# Patient Record
Sex: Male | Born: 1960 | Race: Black or African American | Hispanic: No | Marital: Married | Smoking: Current every day smoker
Health system: Southern US, Community
[De-identification: ages and names within clinical notes are randomized; demographics above are authoritative.]

## PROBLEM LIST (undated history)

## (undated) DIAGNOSIS — E7800 Pure hypercholesterolemia, unspecified: Secondary | ICD-10-CM

## (undated) DIAGNOSIS — I10 Essential (primary) hypertension: Secondary | ICD-10-CM

## (undated) HISTORY — DX: Essential (primary) hypertension: I10

---

## 2012-08-24 ENCOUNTER — Emergency Department: Payer: Self-pay | Admitting: Emergency Medicine

## 2012-09-17 ENCOUNTER — Emergency Department: Payer: Self-pay | Admitting: Emergency Medicine

## 2012-09-21 LAB — BODY FLUID CULTURE

## 2012-10-02 ENCOUNTER — Emergency Department: Payer: Self-pay | Admitting: Emergency Medicine

## 2013-06-14 ENCOUNTER — Emergency Department: Payer: Self-pay | Admitting: Emergency Medicine

## 2013-06-21 IMAGING — CR DG ELBOW COMPLETE 3+V*L*
1 series · 4 of 4 positions shown · non-contrast
Comparison: none

REASON FOR EXAM: s/p injury with painful ROM and swelling
COMMENTS:

PROCEDURE:     DXR - DXR ELBOW LT COMP W/OBLIQUES  - September 17, 2012 [DATE]
RESULT:     Comparison: 08/24/2012

[Series 1: lat · 0.17mm/px · 4 of 4 slices shown]
[im 1/4]
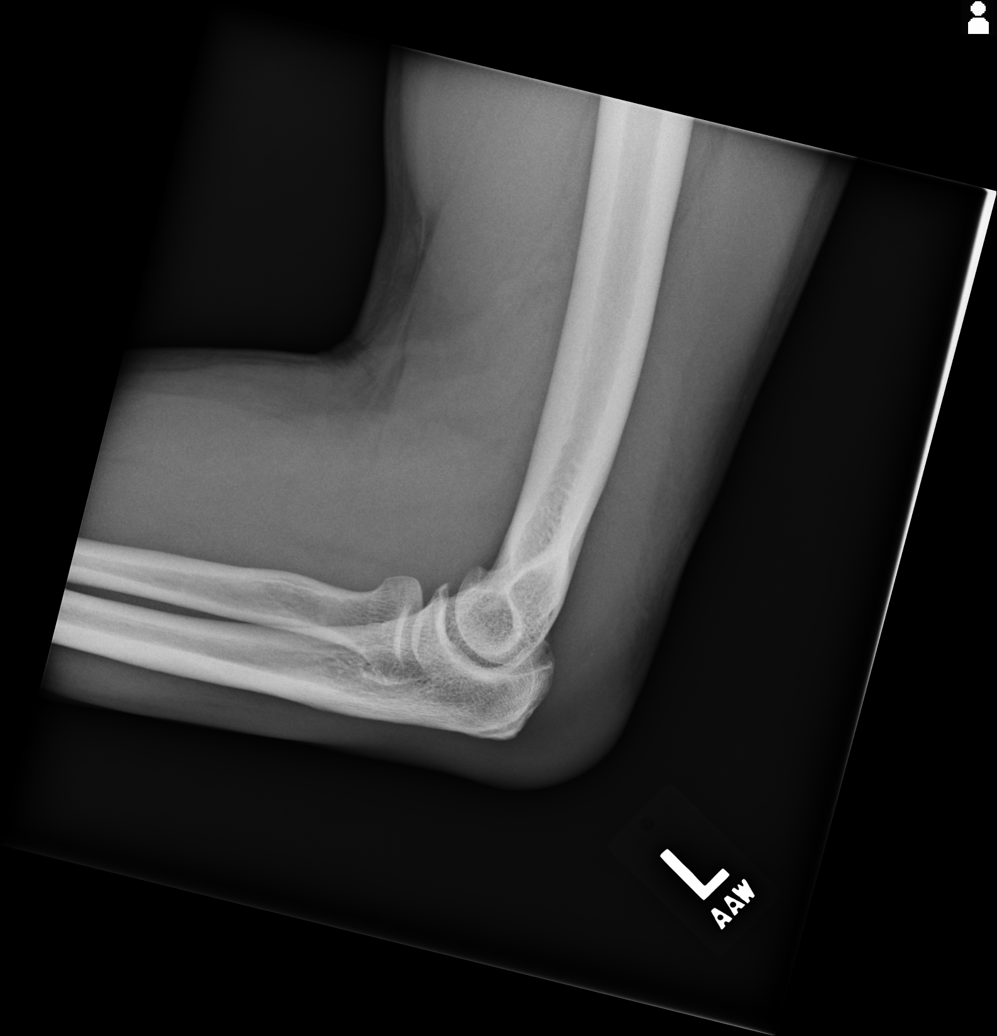
[im 2/4]
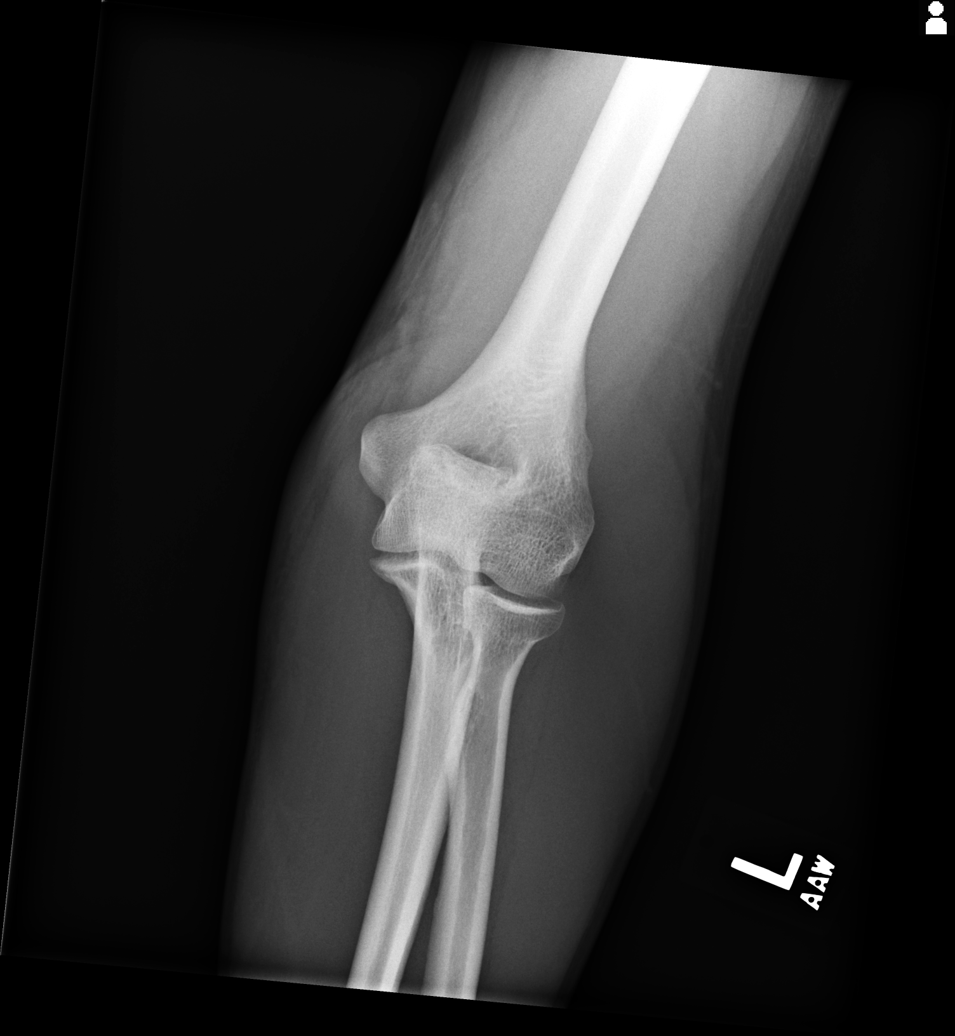
[im 3/4]
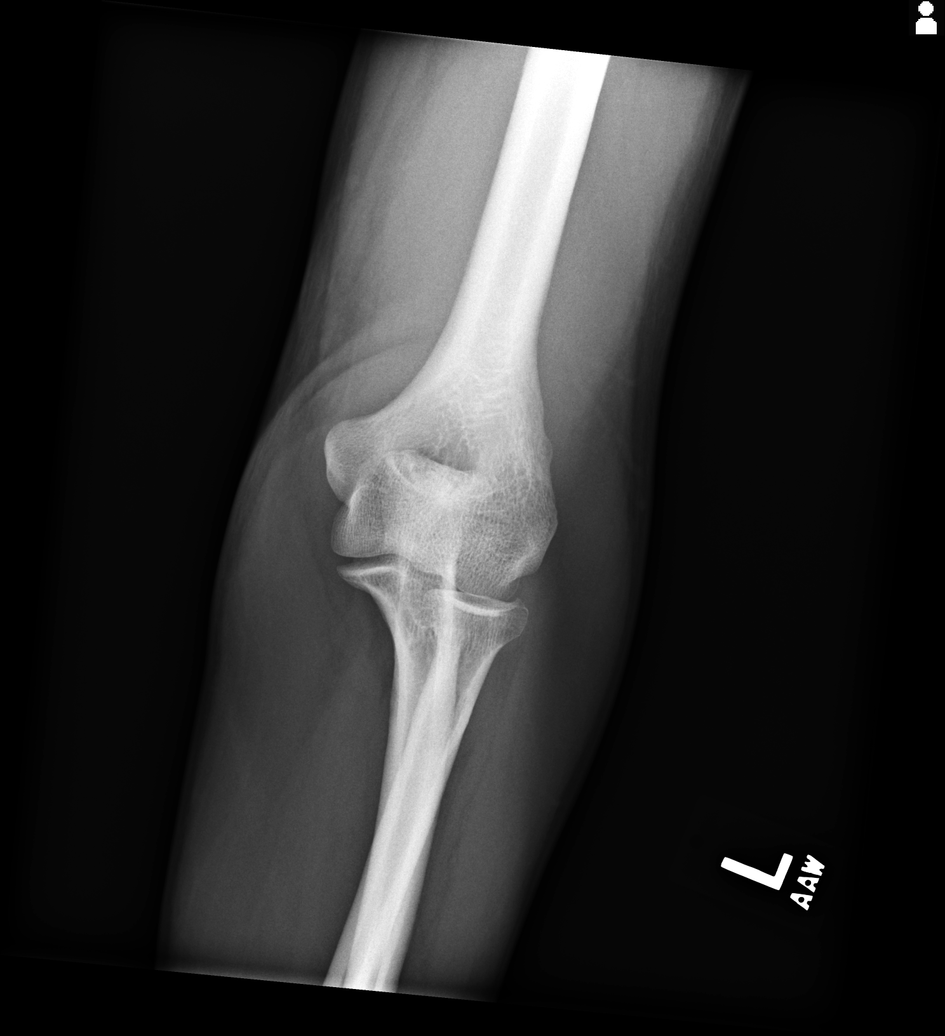
[im 4/4]
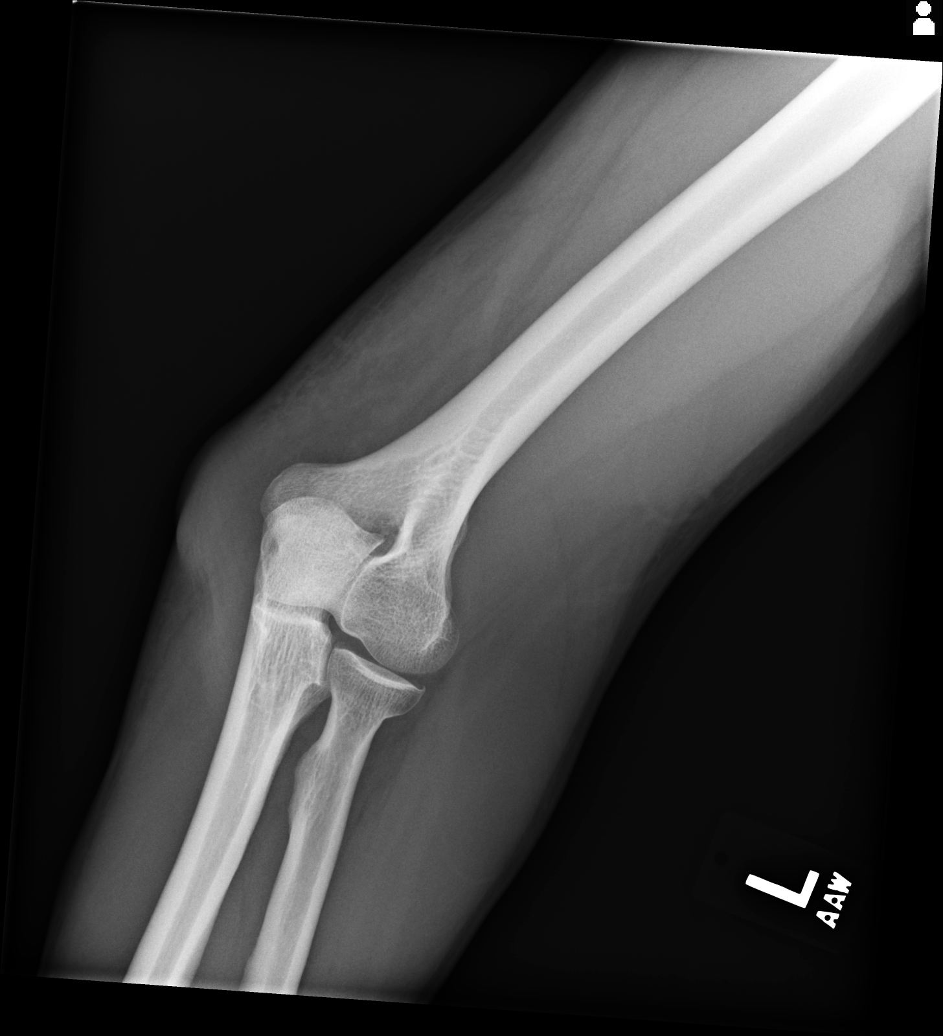

[4 of 4 positions shown; findings below may reference images not displayed]

FINDINGS: There is soft tissue swelling posterior to the olecranon, which is
nonspecific. No elbow effusion. No acute fracture. Normal alignment. There
is minimal osteophytosis along the radial head.
IMPRESSION: No acute fracture. Nonspecific soft tissue swelling posterior to olecranon.

[REDACTED]

## 2014-08-30 ENCOUNTER — Emergency Department: Payer: Self-pay | Admitting: Emergency Medicine

## 2014-08-30 LAB — URINALYSIS, COMPLETE
BILIRUBIN, UR: NEGATIVE
Blood: NEGATIVE
Glucose,UR: NEGATIVE mg/dL (ref 0–75)
Ketone: NEGATIVE
Nitrite: NEGATIVE
PH: 5 (ref 4.5–8.0)
Protein: NEGATIVE
SPECIFIC GRAVITY: 1.023 (ref 1.003–1.030)
Squamous Epithelial: 3
WBC UR: 207 /HPF (ref 0–5)

## 2014-08-30 LAB — BASIC METABOLIC PANEL
Anion Gap: 5 — ABNORMAL LOW (ref 7–16)
BUN: 12 mg/dL (ref 7–18)
CALCIUM: 10.3 mg/dL — AB (ref 8.5–10.1)
Chloride: 108 mmol/L — ABNORMAL HIGH (ref 98–107)
Co2: 28 mmol/L (ref 21–32)
Creatinine: 1.22 mg/dL (ref 0.60–1.30)
EGFR (African American): >60
Glucose: 100 mg/dL — ABNORMAL HIGH (ref 65–99)
Osmolality: 281 (ref 275–301)
Potassium: 4.4 mmol/L (ref 3.5–5.1)
Sodium: 141 mmol/L (ref 136–145)

## 2014-08-30 LAB — CBC WITH DIFFERENTIAL/PLATELET
BASOS PCT: 0.6 %
Basophil #: 0 10*3/uL (ref 0.0–0.1)
EOS ABS: 0.2 10*3/uL (ref 0.0–0.7)
EOS PCT: 2.3 %
HCT: 47.3 % (ref 40.0–52.0)
HGB: 15.4 g/dL (ref 13.0–18.0)
LYMPHS PCT: 32.3 %
Lymphocyte #: 2.5 10*3/uL (ref 1.0–3.6)
MCH: 29.3 pg (ref 26.0–34.0)
MCHC: 32.6 g/dL (ref 32.0–36.0)
MCV: 90 fL (ref 80–100)
MONO ABS: 0.6 x10 3/mm (ref 0.2–1.0)
Monocyte %: 8.3 %
NEUTROS ABS: 4.3 10*3/uL (ref 1.4–6.5)
NEUTROS PCT: 56.5 %
Platelet: 347 10*3/uL (ref 150–440)
RBC: 5.26 10*6/uL (ref 4.40–5.90)
RDW: 13.7 % (ref 11.5–14.5)
WBC: 7.6 10*3/uL (ref 3.8–10.6)

## 2015-03-13 ENCOUNTER — Encounter: Payer: Self-pay | Admitting: Emergency Medicine

## 2015-03-13 ENCOUNTER — Emergency Department
Admission: EM | Admit: 2015-03-13 | Discharge: 2015-03-13 | Disposition: A | Discharge status: Home / Self Care | Payer: Self-pay | Attending: Emergency Medicine | Admitting: Emergency Medicine

## 2015-03-13 DIAGNOSIS — A549 Gonococcal infection, unspecified: Secondary | ICD-10-CM | POA: Insufficient documentation

## 2015-03-13 DIAGNOSIS — Z720 Tobacco use: Secondary | ICD-10-CM | POA: Insufficient documentation

## 2015-03-13 DIAGNOSIS — R319 Hematuria, unspecified: Secondary | ICD-10-CM

## 2015-03-13 DIAGNOSIS — N390 Urinary tract infection, site not specified: Secondary | ICD-10-CM | POA: Insufficient documentation

## 2015-03-13 DIAGNOSIS — I10 Essential (primary) hypertension: Secondary | ICD-10-CM | POA: Insufficient documentation

## 2015-03-13 DIAGNOSIS — A749 Chlamydial infection, unspecified: Secondary | ICD-10-CM | POA: Insufficient documentation

## 2015-03-13 HISTORY — DX: Essential (primary) hypertension: I10

## 2015-03-13 HISTORY — DX: Pure hypercholesterolemia, unspecified: E78.00

## 2015-03-13 LAB — URINALYSIS COMPLETE WITH MICROSCOPIC (ARMC ONLY)
BACTERIA UA: NONE SEEN
Bilirubin Urine: NEGATIVE
Glucose, UA: NEGATIVE mg/dL
HGB URINE DIPSTICK: NEGATIVE
Ketones, ur: NEGATIVE mg/dL
NITRITE: NEGATIVE
Protein, ur: NEGATIVE mg/dL
SPECIFIC GRAVITY, URINE: 1.025 (ref 1.005–1.030)
pH: 5 (ref 5.0–8.0)

## 2015-03-13 LAB — CHLAMYDIA/NGC RT PCR (ARMC ONLY)
CHLAMYDIA TR: DETECTED
N gonorrhoeae: DETECTED

## 2015-03-13 MED ORDER — CEFTRIAXONE SODIUM 250 MG IJ SOLR
INTRAMUSCULAR | Status: DC
Start: 2015-03-13 — End: 2015-03-14
  Filled 2015-03-13: qty 250

## 2015-03-13 MED ORDER — AZITHROMYCIN 1 G PO PACK
1.0000 g | PACK | Freq: Once | ORAL | Status: AC
  Administered 2015-03-13: 1 g via ORAL

## 2015-03-13 MED ORDER — CEFTRIAXONE SODIUM 250 MG IJ SOLR
250.0000 mg | Freq: Once | INTRAMUSCULAR | Status: AC
  Administered 2015-03-13: 250 mg via INTRAMUSCULAR

## 2015-03-13 MED ORDER — CEPHALEXIN 500 MG PO CAPS: 500.0000 mg | ORAL_CAPSULE | Freq: Two times a day (BID) | ORAL | Status: AC

## 2015-03-13 MED ORDER — AZITHROMYCIN 1 G PO PACK
PACK | ORAL | Status: AC
  Administered 2015-03-13: 1 g via ORAL
  Filled 2015-03-13: qty 1

## 2015-03-13 NOTE — ED Provider Notes (Signed)
[facility] Emergency Department Provider Note   ____________________________________________  Time seen: 0915  I have reviewed the triage vital signs and the nursing notes.   HISTORY  Chief Complaint Hematuria   History limited by: Not Limited   HPI Jose Douglas is a 54 y.o. male presents to the emergency department today because of concerns for hematuria. Patient states for the past 5 days he has noticed blood in his urine. This progressed point where he is passing clots. The patient states that this is accompanied with some penile pain. He describes it as being located at the glands in the volar aspect of the penis. Additionally the patient has noticed some white discharge from the penis prior to her urinary stream. Patient denies any fevers, shortness breath, nausea or vomiting.   Past Medical History  Diagnosis Date  . Hypertension   . Hypercholesteremia     There are no active problems to display for this patient.   History reviewed. No pertinent past surgical history.  Current Outpatient Rx  Name  Route  Sig  Dispense  Refill  . cephALEXin (KEFLEX) 500 MG capsule   Oral   Take 1 capsule (500 mg total) by mouth 2 (two) times daily.   10 capsule   0     Allergies Review of patient's allergies indicates no known allergies.  History reviewed. No pertinent family history.  Social History History  Substance Use Topics  . Smoking status: Current Every Day Smoker  . Smokeless tobacco: Not on file  . Alcohol Use: Yes    Review of Systems  Constitutional: Negative for fever. Cardiovascular: Negative for chest pain. Respiratory: Negative for shortness of breath. Gastrointestinal: Negative for abdominal pain, vomiting and diarrhea. Genitourinary: Positive for glans pain, hematuria Musculoskeletal: Negative for back pain. Skin: Negative for rash. Neurological: Negative for headaches, focal weakness or numbness.   10-point ROS  otherwise negative.  ____________________________________________   PHYSICAL EXAM:  VITAL SIGNS: ED Triage Vitals  Enc Vitals Group     BP 03/13/15 1711 147/95 mmHg     Pulse Rate 03/13/15 1711 104     Resp --      Temp 03/13/15 1711 98.9 F (37.2 C)     Temp Source 03/13/15 1711 Oral     SpO2 03/13/15 1711 98 %     Weight 03/13/15 1711 198 lb (89.812 kg)     Height 03/13/15 1711 6\' 1"  (1.854 m)     Head Cir --      Peak Flow --      Pain Score 03/13/15 1712 4   Constitutional: Alert and oriented. Well appearing and in no distress. Eyes: Conjunctivae are normal. PERRL. Normal extraocular movements. ENT   Head: Normocephalic and atraumatic.   Nose: No congestion/rhinnorhea.   Mouth/Throat: Mucous membranes are moist.   Neck: No stridor. Hematological/Lymphatic/Immunilogical: No cervical lymphadenopathy. Cardiovascular: Normal rate, regular rhythm.  No murmurs, rubs, or gallops. Respiratory: Normal respiratory effort without tachypnea nor retractions. Breath sounds are clear and equal bilaterally. No wheezes/rales/rhonchi. Gastrointestinal: Soft and nontender. No distention.  Genitourinary: No external lesions noted, no discharge was expressed. No tenderness or swelling.  Musculoskeletal: Normal range of motion in all extremities. No joint effusions.  No lower extremity tenderness nor edema. Neurologic:  Normal speech and language. No gross focal neurologic deficits are appreciated. Speech is normal.  Skin:  Skin is warm, dry and intact. No rash noted. Psychiatric: Mood and affect are normal. Speech and behavior are normal. Patient  exhibits appropriate insight and judgment.  ____________________________________________    LABS (pertinent positives/negatives)  Labs Reviewed  URINALYSIS COMPLETEWITH MICROSCOPIC ([facility] ONLY) - Abnormal; Notable for the following:    Color, Urine YELLOW (*)    APPearance HAZY (*)    Leukocytes, UA 2+ (*)    Squamous Epithelial  / LPF 0-5 (*)    All other components within normal limits  CHLAMYDIA/NGC RT PCR ([facility] ONLY)     ____________________________________________   EKG  None  ____________________________________________    RADIOLOGY  None  ____________________________________________   PROCEDURES  Procedure(s) performed: None  Critical Care performed: No  ____________________________________________   INITIAL IMPRESSION / ASSESSMENT AND PLAN / ED COURSE  Pertinent labs & imaging results that were available during my care of the patient were reviewed by me and considered in my medical decision making (see chart for details).  She presented to the emergency department with concerns for hematuria and some penile discomfort. Patient with numerous white blood cell counts on UA. Additionally Chlamydia and gonorrhea tested positive. Treated and discussed with patient and partner need for partner to be tested. Discussed that they need to abstain from sexual activity until both partners are treated and symptomatic free. Additionally encouraged and advised both patient and partner to undergo further STD testing including HIV and syphilis.  ____________________________________________   FINAL CLINICAL IMPRESSION(S) / ED DIAGNOSES  Final diagnoses:  Hematuria  UTI (lower urinary tract infection)  Chlamydia  Gonorrhea     Phineas Semen, MD 03/13/15 2355

## 2015-03-13 NOTE — ED Notes (Signed)
Pt reports urinating blood clots off and on x 4 days.  Soreness to tip of penis

## 2015-03-13 NOTE — Discharge Instructions (Signed)
Please seek medical attention for any high fevers, chest pain, shortness of breath, change in behavior, persistent vomiting, bloody stool or any other new or concerning symptoms. Please refrain from any sexual activity until you have finished your antibiotics. Please have any sexual partners tested for infections.  Urinary Tract Infection Urinary tract infections (UTIs) can develop anywhere along your urinary tract. Your urinary tract is your body's drainage system for removing wastes and extra water. Your urinary tract includes two kidneys, two ureters, a bladder, and a urethra. Your kidneys are a pair of bean-shaped organs. Each kidney is about the size of your fist. They are located below your ribs, one on each side of your spine. CAUSES Infections are caused by microbes, which are microscopic organisms, including fungi, viruses, and bacteria. These organisms are so small that they can only be seen through a microscope. Bacteria are the microbes that most commonly cause UTIs. SYMPTOMS  Symptoms of UTIs may vary by age and gender of the patient and by the location of the infection. Symptoms in young women typically include a frequent and intense urge to urinate and a painful, burning feeling in the bladder or urethra during urination. Older women and men are more likely to be tired, shaky, and weak and have muscle aches and abdominal pain. A fever may mean the infection is in your kidneys. Other symptoms of a kidney infection include pain in your back or sides below the ribs, nausea, and vomiting. DIAGNOSIS To diagnose a UTI, your caregiver will ask you about your symptoms. Your caregiver also will ask to provide a urine sample. The urine sample will be tested for bacteria and white blood cells. White blood cells are made by your body to help fight infection. TREATMENT  Typically, UTIs can be treated with medication. Because most UTIs are caused by a bacterial infection, they usually can be treated with  the use of antibiotics. The choice of antibiotic and length of treatment depend on your symptoms and the type of bacteria causing your infection. HOME CARE INSTRUCTIONS  If you were prescribed antibiotics, take them exactly as your caregiver instructs you. Finish the medication even if you feel better after you have only taken some of the medication.  Drink enough water and fluids to keep your urine clear or pale yellow.  Avoid caffeine, tea, and carbonated beverages. They tend to irritate your bladder.  Empty your bladder often. Avoid holding urine for long periods of time.  Empty your bladder before and after sexual intercourse.  After a bowel movement, women should cleanse from front to back. Use each tissue only once. SEEK MEDICAL CARE IF:   You have back pain.  You develop a fever.  Your symptoms do not begin to resolve within 3 days. SEEK IMMEDIATE MEDICAL CARE IF:   You have severe back pain or lower abdominal pain.  You develop chills.  You have nausea or vomiting.  You have continued burning or discomfort with urination. MAKE SURE YOU:   Understand these instructions.  Will watch your condition.  Will get help right away if you are not doing well or get worse. Document Released: 07/05/2005 Document Revised: 03/26/2012 Document Reviewed: 11/03/2011 [facility] Patient Information 2015 [place], [place]. This information is not intended to replace advice given to you by your health care provider. Make sure you discuss any questions you have with your health care provider.

## 2015-03-13 NOTE — ED Notes (Signed)
MD at bedside discussing STD results.

## 2015-06-03 IMAGING — CT CT ABD-PELV W/ CM
2 of 5 series · 16 of 46 positions shown, 18 images · IV contrast (isovue)
Comparison: None.

CLINICAL DATA: Left flank pain for 2-3 weeks.

EXAM:
CT ABDOMEN AND PELVIS WITH CONTRAST
TECHNIQUE: Multidetector CT imaging of the abdomen and pelvis was performed
using the standard protocol following bolus administration of
intravenous contrast.
CONTRAST:  100 cc Isovue 300 IV.

[Series 2: routine abd pel with · axial · 0.69mm/px · z∈[-282,+143]mm · 13 of 95 slices shown, 15 images]
[im 5/95  soft-tissue]
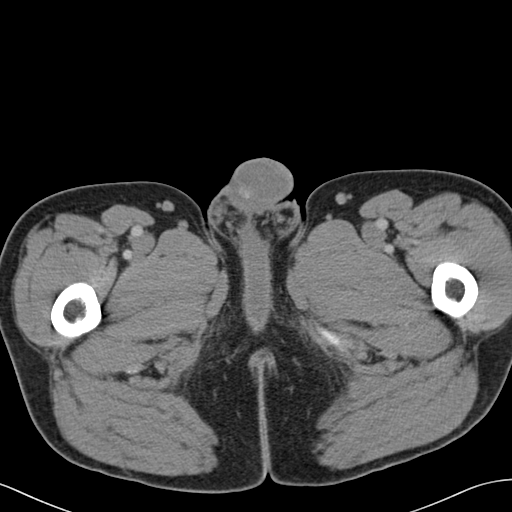
[im 5/95  bone]
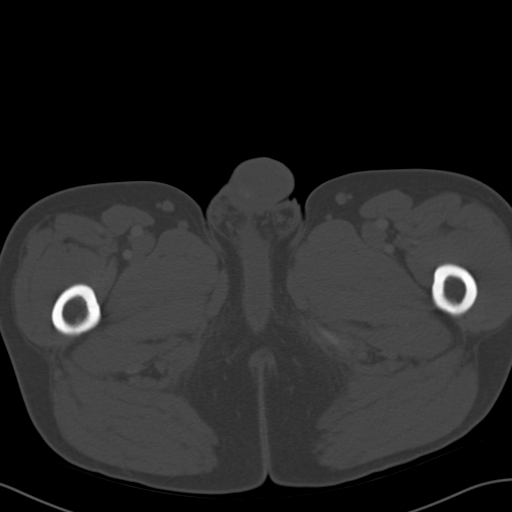
[im 15/95  soft-tissue]
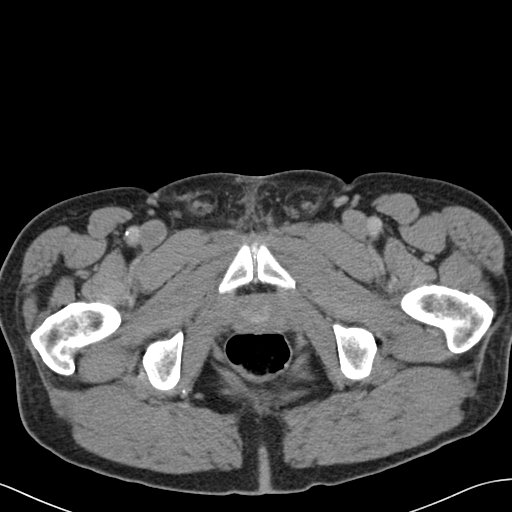
[im 20/95  soft-tissue]
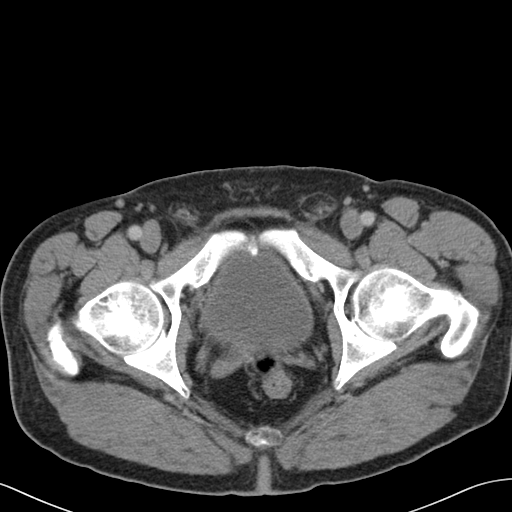
[im 25/95  soft-tissue]
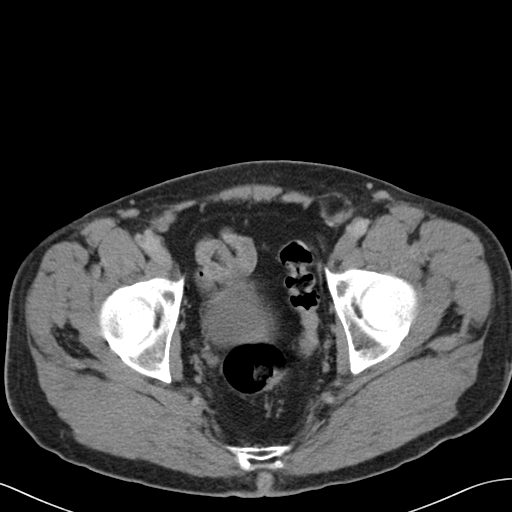
[im 35/95  soft-tissue]
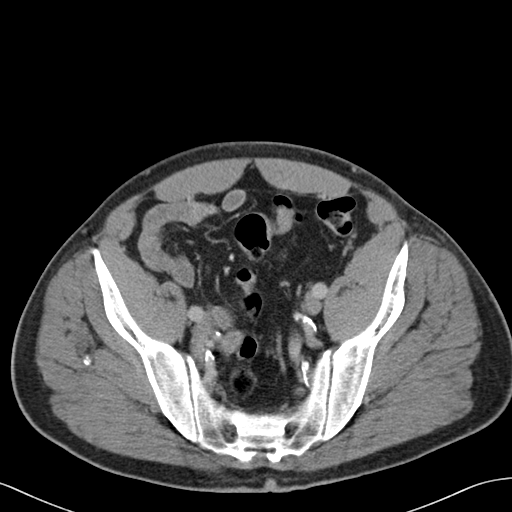
[im 40/95  soft-tissue]
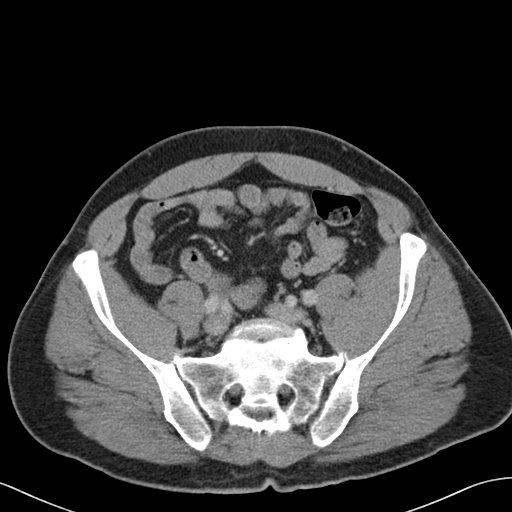
[im 50/95  soft-tissue]
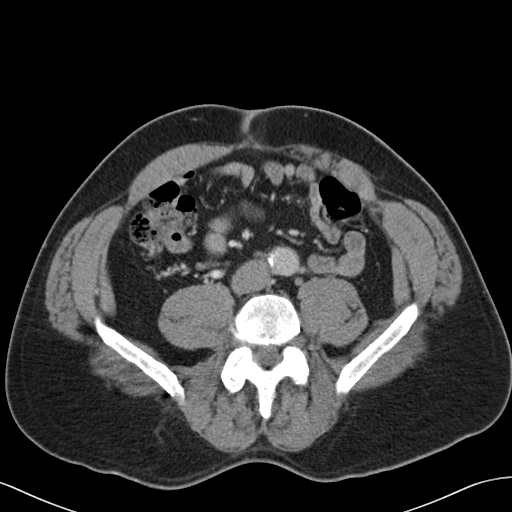
[im 55/95  soft-tissue]
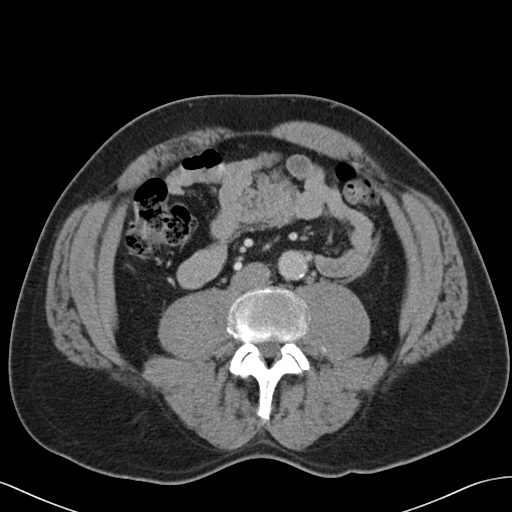
[im 60/95  soft-tissue]
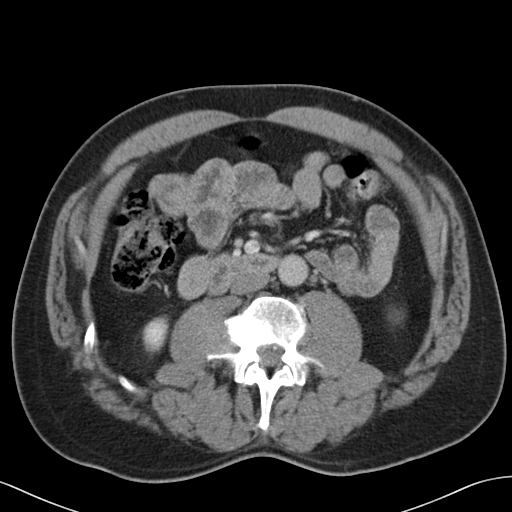
[im 60/95  bone]
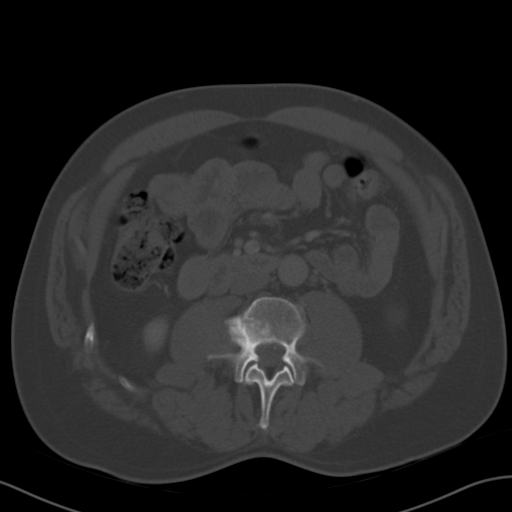
[im 70/95  soft-tissue]
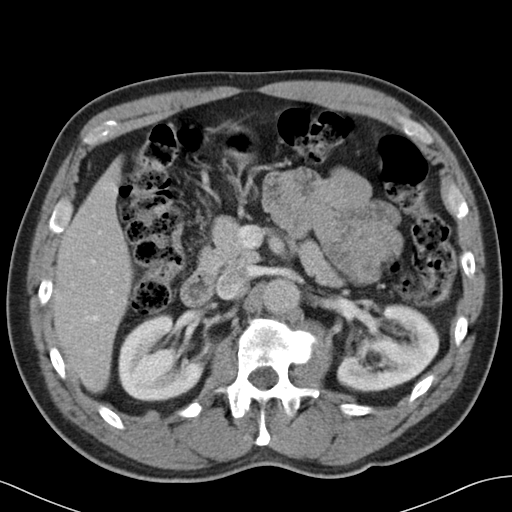
[im 75/95  soft-tissue]
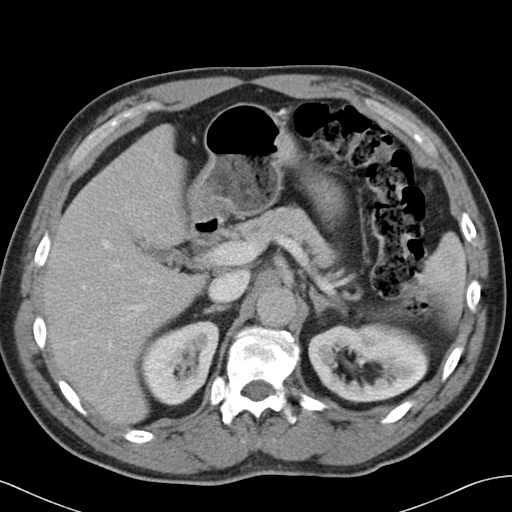
[im 80/95  soft-tissue]
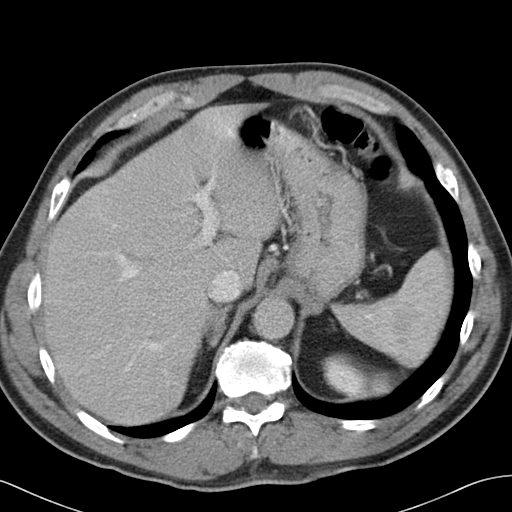
[im 90/95  soft-tissue]
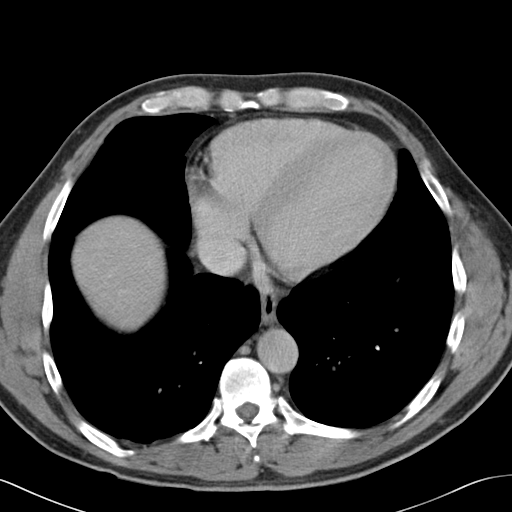

[Series 6: cor routine abd pel with · coronal · 0.69mm/px · 3 of 142 slices shown]
[im 48/142  soft-tissue]
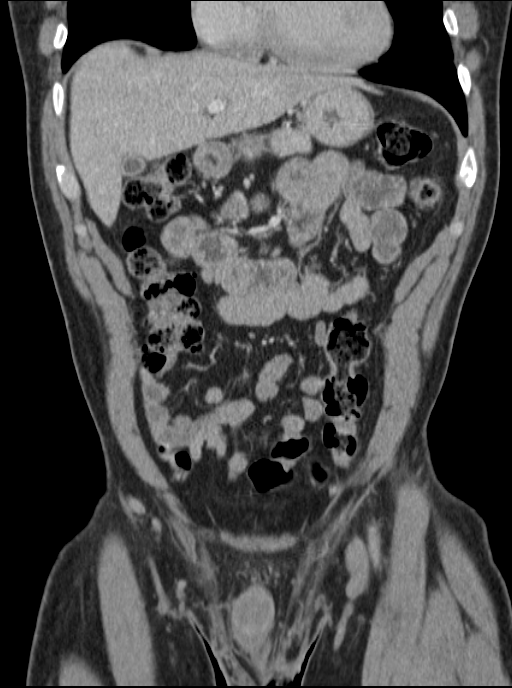
[im 63/142  soft-tissue]
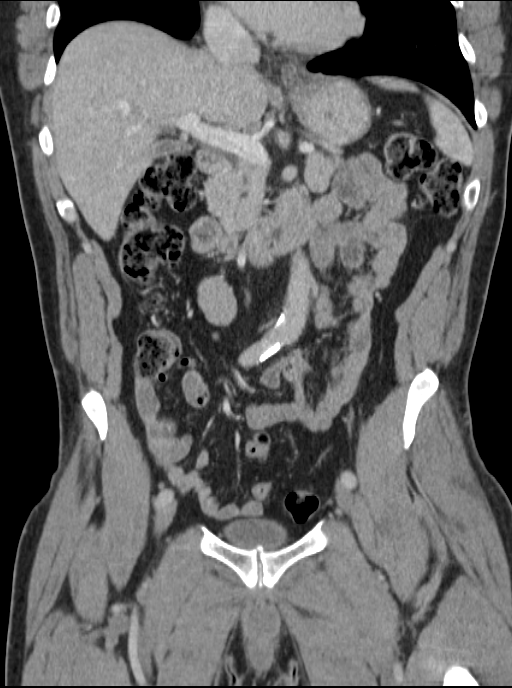
[im 79/142  soft-tissue]
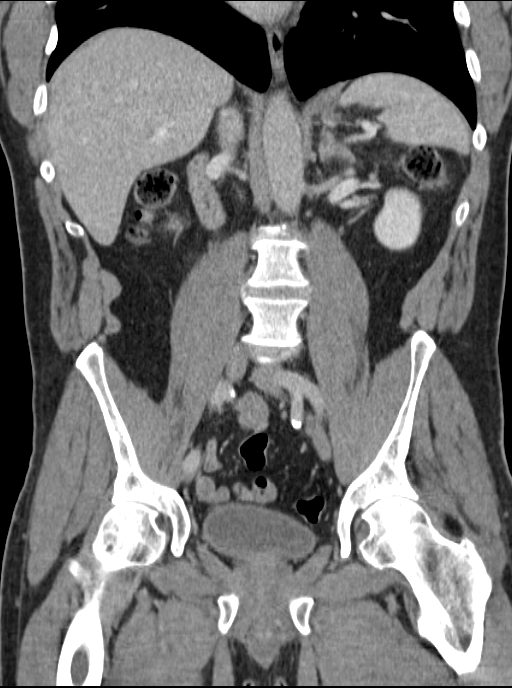

[16 of 46 positions shown; findings below may reference images not displayed]

FINDINGS: Heart is borderline in size.  Lung bases are clear.  No effusions.

Liver, gallbladder, spleen, pancreas, adrenals and kidneys are
unremarkable.

Appendix is visualized and is normal. Stomach and small bowel are
decompressed, grossly unremarkable. Urinary bladder is unremarkable.
No free fluid, free air or adenopathy.

Scattered sigmoid diverticula. No active diverticulitis. Small left
inguinal hernia containing fat. Aorta and iliac vessels are
calcified and tortuous, non aneurysmal.

No acute bony abnormality or focal bone lesion.
IMPRESSION: No acute findings in the abdomen or pelvis.

## 2016-06-05 ENCOUNTER — Emergency Department: Payer: Self-pay

## 2016-06-05 ENCOUNTER — Emergency Department
Admission: EM | Admit: 2016-06-05 | Discharge: 2016-06-05 | Disposition: A | Discharge status: Home / Self Care | Payer: Self-pay | Attending: Emergency Medicine | Admitting: Emergency Medicine

## 2016-06-05 DIAGNOSIS — R300 Dysuria: Secondary | ICD-10-CM | POA: Insufficient documentation

## 2016-06-05 DIAGNOSIS — F17210 Nicotine dependence, cigarettes, uncomplicated: Secondary | ICD-10-CM | POA: Insufficient documentation

## 2016-06-05 DIAGNOSIS — K4090 Unilateral inguinal hernia, without obstruction or gangrene, not specified as recurrent: Secondary | ICD-10-CM | POA: Insufficient documentation

## 2016-06-05 DIAGNOSIS — I10 Essential (primary) hypertension: Secondary | ICD-10-CM | POA: Insufficient documentation

## 2016-06-05 DIAGNOSIS — N390 Urinary tract infection, site not specified: Secondary | ICD-10-CM | POA: Insufficient documentation

## 2016-06-05 LAB — URINALYSIS, REFLEX TO MICROSCOPIC EXAM IF INDICATED
Bilirubin, UA: NEGATIVE
Blood, UA: NEGATIVE
Glucose, UA: NEGATIVE
Ketones UA: NEGATIVE
Nitrite, UA: NEGATIVE
Protein, UR: NEGATIVE
Specific Gravity UA: 1.025 (ref 1.001–1.035)
Urine pH: 5 (ref 5.0–8.0)
Urobilinogen, UA: 2 mg/dL (ref 0.2–2.0)

## 2016-06-05 MED ORDER — CEPHALEXIN 500 MG PO CAPS
500.0000 mg | ORAL_CAPSULE | Freq: Two times a day (BID) | ORAL | Status: AC
Start: 2016-06-05 — End: 2016-06-12

## 2016-06-05 MED ORDER — HYDROCHLOROTHIAZIDE 25 MG PO TABS
25.0000 mg | ORAL_TABLET | Freq: Every day | ORAL | Status: AC
Start: 2016-06-05 — End: 2016-06-19

## 2016-06-05 NOTE — Discharge Instructions (Signed)
Dysuria    You have been seen for dysuria.     Dysuria is the medical term for painful urination. There are several causes and they include:   Bladder infection.   Sexually transmitted diseases (STDs). These include gonorrhea, chlamydia, syphilis, herpes and others).   Prostate problem.   Kidney stones.   Bladder, prostate or kidney cancer (these are rare).    Although the cause of your pain and discomfort is not yet known, we believe it is OK for you to go home.    The doctor will decide whether to prescribe pain medicine.    Follow up with your family doctor to try to find a cause for your pain.    YOU SHOULD SEEK MEDICAL ATTENTION IMMEDIATELY, EITHER HERE OR AT THE NEARST EMERGENCY DEPARTMENT IF ANY OF THE FOLLOWING OCCURS:   You have pain in your belly, pelvis or back.   You have fever (temperature higher than 100.68F / 38C) that won't go away.   You have vomiting that does not stop after a few times.   You are unable to urinate.   You have any other concerns.              Hernia    You have been diagnosed with a hernia.    Sometimes there is a weak spot in the abdominal wall (the tissue and muscle inside the belly). A hernia happens when a piece of intestine or surrounding fat pokes through this wall. This creates a bulge that you can see or feel with your hand.    If the intestine can be pushed back inside the wall, it is not dangerous.    Sometimes, the intestine that pokes through is twisted or trapped, causing severe (major) pain that does not go away.    Hernias are usually in the groin. They may also occur around the belly button or near a previous incision (cut). Hernias may happen very suddenly or slowly over a period of several months or years.    The hernia may cause a mild dull ache.    Your pain may be treated with medicine.    Limit heavy lifting, straining and pushing.    Your doctor may tell you to wear a binder or truss. This is like a brace that fits around the  belly.    YOU SHOULD SEEK MEDICAL ATTENTION IMMEDIATELY, EITHER HERE OR AT THE NEAREST EMERGENCY DEPARTMENT, IF ANY OF THE FOLLOWING OCCURS:   The pain gets worse or changes.   The hernia sticks out and won t go back in. If this happens, the hernia may get trapped and the blood flow may be cut off. This can be a very dangerous problem that needs medical attention right away!   Nausea (feeling sick) or vomiting (throwing up).              Urinary Tract Infection    You have been diagnosed with a lower urinary tract infection (UTI). This is also called cystitis.    Cystitis is an infection in your bladder. Your doctor diagnosed it by testing your urine. Cystitis usually causes burning with urination or frequent urination. It might make you feel like you have to urinate even when you don't.     Cystitis is usually treated with antibiotics and medicine to help with pain.    It is VERY IMPORTANT that you fill your prescription and take all of the antibiotics as directed. If a lower urinary tract infection goes  untreated for too long, it can become a kidney infection.    FOR WOMEN: To reduce the risk of getting cystitis again:   Always urinate before and after sexual intercourse.   Always wipe from front to back after urinating or having a bowel movement. Do not wipe from back to front.   Drink plenty of fluids. Try to drink cranberry or blueberry juice. These juices have a chemical that stops bacteria from "sticking" to the bladder.    YOU SHOULD SEEK MEDICAL ATTENTION IMMEDIATELY, EITHER HERE OR AT THE NEAREST EMERGENCY DEPARTMENT, IF ANY OF THE FOLLOWING OCCURS:   You have a fever (temperature higher than 100.10F / 38C) or shaking chills.   You feel nauseated or vomit.   You have pain in your side or back.   You don't get better after taking all of your antibiotics.   You have any new symptoms or concerns.   You feel worse or do not improve.      Hypertension    You have been diagnosed with  elevated blood pressure.    The medical term for high blood pressure is hypertension. Many people feel anxious or uncomfortable about being at the hospital. If you feel anxious today, this could make your blood pressure appear high, even if your blood pressure is usually normal. Check your blood pressure several more times when you are not feeling stress. Keep a record of these readings and give this information to your regular doctor. He or she will decide whether you have hypertension that requires medical treatment.    If your blood pressure becomes extremely high all of a sudden, you will probably notice symptoms. In fact, very high blood pressure is a medical emergency. Most people with hypertension have blood pressure that is only a little too high. Mild high blood pressure does not cause specific symptoms. Instead, the effects of hypertension develop slowly over time. Untreated hypertension can affect the heart, brain, kidneys, eyes, and blood vessels. Unfortunately, by the time side-effects become noticeable, the body has already been damaged. This is why hypertension is called "the silent killer!"    It is important to follow up with your regular doctor. Check your blood pressure several times in the next 1 to 2 weeks and tell your doctor about the results. It may be helpful to keep a log or a journal where you can write down your blood pressures. Note the time of day and the activity you were doing when the reading was taken.    YOU SHOULD SEEK MEDICAL ATTENTION IMMEDIATELY, EITHER HERE OR AT THE NEAREST EMERGENCY DEPARTMENT, IF ANY OF THE FOLLOWING OCCURS:   You have a headache.   You have chest pain.   You are short of breath or have trouble breathing.    You feel weak, especially on only one side of the body.   Your symptoms get worse or you have other concerns.

## 2016-06-05 NOTE — ED Provider Notes (Signed)
EMERGENCY DEPARTMENT HISTORY AND PHYSICAL EXAM     Physician/Midlevel provider first contact with patient: 06/05/16 1315         Date: 06/05/2016  Patient Name: Gary Parker    History of Presenting Illness     Chief Complaint   Patient presents with   . Hernia       History Provided By: Patient    Chief Complaint: Hernia  Onset: This morning, PTA  Timing: Intermittent  Location: Suprapubic abd  Quality: Burning  Severity: Moderate  Exacerbating factors: Worse with palpation   Alleviating factors: None  Associated Symptoms: None  Pertinent Negatives: nausea, vomiting, fever, dysuria    Additional History: Gary Parker is a 55 y.o. male presenting to the ED with c/o suprapubic hernia that came out this morning PTA. Pt reports that he was able to place the hernia back today, however, he notes that the hernia is getting larger and harder to push back in. He states that the pain is a burning sensation. Also c/o mild burning when he pees.  No penile d/c. He denies nausea, vomiting, fever or any other sxs. Pt states he no longer takes his blood pressure medication.     PCP: No primary care provider on file.  SPECIALISTS:    No current facility-administered medications for this encounter.     Current Outpatient Prescriptions   Medication Sig Dispense Refill   . cephalexin (KEFLEX) 500 MG capsule Take 1 capsule (500 mg total) by mouth 2 (two) times daily. 14 capsule 0   . hydroCHLOROthiazide (HYDRODIURIL) 25 MG tablet Take 1 tablet (25 mg total) by mouth daily. 14 tablet 0       Past History     Past Medical History:  Past Medical History   Diagnosis Date   . Hypertension        Past Surgical History:  History reviewed. No pertinent past surgical history.    Family History:  History reviewed. No pertinent family history.    Social History:  Social History   Substance Use Topics   . Smoking status: Current Every Day Smoker -- 39 years     Types: Cigarettes   . Smokeless tobacco: None   . Alcohol Use: Yes        Allergies:  No Known Allergies    Review of Systems     Review of Systems   Constitutional: Negative for fever.   HENT: Negative for nosebleeds.    Eyes: Negative for redness.   Respiratory: Negative for shortness of breath.    Cardiovascular: Negative for chest pain.   Gastrointestinal: Negative for nausea and vomiting.        +hernia   Genitourinary: Negative for flank pain.   Skin: Negative for pallor.   Allergic/Immunologic:        NKDA   Neurological: Negative for dizziness.   Psychiatric/Behavioral: Negative for suicidal ideas.     Physical Exam   BP 156/97 mmHg  Pulse 58  Temp(Src) 98 F (36.7 C)  Resp 17  Ht 6\' 1"  (1.854 m)  Wt 86.183 kg  BMI 25.07 kg/m2  SpO2 97%    Physical Exam   Constitutional: He is oriented to person, place, and time. He appears well-developed and well-nourished.   HENT:   Head: Normocephalic.   Mouth/Throat: Oropharynx is clear and moist.   Eyes: Conjunctivae are normal. Pupils are equal, round, and reactive to light.   Neck: Normal range of motion.   Cardiovascular: Normal rate, regular  rhythm and normal heart sounds.    Pulmonary/Chest: Effort normal and breath sounds normal.   Abdominal: Soft. A hernia (L inguinal, easily reducible) is present.   Musculoskeletal: Normal range of motion.   Neurological: He is alert and oriented to person, place, and time.   Skin: Skin is warm.   Psychiatric: He has a normal mood and affect.   Nursing note and vitals reviewed.        Diagnostic Study Results     Labs -     Results     Procedure Component Value Units Date/Time    UA, Reflex to Microscopic (pts 3 + yrs) [[id number]]  (Abnormal) Collected:  06/05/16 1347    Specimen Information:  Urine Updated:  06/05/16 1405     Urine Type Clean Catch      Color, UA Yellow      Clarity, UA Hazy      Specific Gravity UA 1.025      Urine pH 5.0      Leukocyte Esterase, UA Small (A)      Nitrite, UA Negative      Protein, UR Negative      Glucose, UA Negative      Ketones UA Negative       Urobilinogen, UA 2.0 mg/dL      Bilirubin, UA Negative      Blood, UA Negative      RBC, UA 6 - 10 (A) /hpf      WBC, UA 11 - 25 (A) /hpf      Squamous Epithelial Cells, Urine 0 - 5 /hpf      Urine Mucus Present           Radiologic Studies -   Radiology Results (24 Hour)     ** No results found for the last 24 hours. **      .    Medical Decision Making   I am the first provider for this patient.    I reviewed the vital signs, available nursing notes, past medical history, past surgical history, family history and social history.    Vital Signs-Reviewed the patient's vital signs.     Patient Vitals for the past 12 hrs:   BP Temp Pulse Resp   06/05/16 1340 (!) 156/97 mmHg - (!) 58 17   06/05/16 1238 (!) 160/134 mmHg 98 F (36.7 C) 70 16       Pulse Oximetry Analysis - Normal 98% on RA    Old Medical Records: Old medical records.Nursing notes.     ED Course:     2:34 PM - 2:35 PM - Discussed all results with pt and counseled on diagnosis, f/u plans, medication use, and signs and symptoms when to return to ED.  Pt is stable and ready for discharge.       Provider Notes: 55 yo male with L inguinal hernia, easily reducible, no evidence of incarceration or strangulation.  Also c/o mild dysuria, but no penile d/c.  UA with WBCs.  Will d/c on keflex for UTI (culture sent).  Also with elevated BP after not taking BP meds for several months.  Written for short course of his home HCTZ. Discussed strict returns, close Surgery and [place] follow-up.        Diagnosis     Clinical Impression:   1. Unilateral inguinal hernia without obstruction or gangrene, recurrence not specified    2. Acute UTI    3. Dysuria    4. Essential hypertension  Treatment Plan:   ED Disposition     Discharge Gary Parker discharge to home/self care.    Condition at disposition: Stable              _______________________________      Attestations: This note is prepared by Gaylene Brooks, acting as scribe for Caroline Sauger, MD.    Caroline Sauger,  MD - The scribe's documentation has been prepared under my direction and personally reviewed by me in its entirety.  I confirm that the note above accurately reflects all work, treatment, procedures, and medical decision making performed by me.    _______________________________      Ashley Jacobs, MD  06/06/16 [phone]

## 2016-06-05 NOTE — ED Notes (Signed)
Pt is here today due to a hernia on his left groin area that he first noticed about 5-6 months ago. The pt states that the hernia has ben getting worse over the past week and more painful.

## 2016-06-06 ENCOUNTER — Telehealth: Payer: Self-pay
